# Patient Record
Sex: Male | Born: 1995 | Race: White | Hispanic: No | Marital: Single | Smoking: Never smoker
Health system: Southern US, Community
[De-identification: ages and names within clinical notes are randomized; demographics above are authoritative.]

## PROBLEM LIST (undated history)

## (undated) DIAGNOSIS — G43909 Migraine, unspecified, not intractable, without status migrainosus: Secondary | ICD-10-CM

## (undated) HISTORY — DX: Migraine, unspecified, not intractable, without status migrainosus: G43.909

---
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
## 2013-09-10 ENCOUNTER — Encounter: Payer: Self-pay | Admitting: Family Medicine

## 2013-09-10 ENCOUNTER — Ambulatory Visit (INDEPENDENT_AMBULATORY_CARE_PROVIDER_SITE_OTHER): Payer: Managed Care, Other (non HMO) | Admitting: Family Medicine

## 2013-09-10 VITALS — BP 126/88 | Wt 185.0 lb

## 2013-09-10 DIAGNOSIS — B9789 Other viral agents as the cause of diseases classified elsewhere: Secondary | ICD-10-CM

## 2013-09-10 DIAGNOSIS — R509 Fever, unspecified: Secondary | ICD-10-CM

## 2013-09-10 DIAGNOSIS — B349 Viral infection, unspecified: Secondary | ICD-10-CM

## 2013-09-10 LAB — POCT INFLUENZA A/B
Influenza A, POC: NEGATIVE
Influenza B, POC: NEGATIVE

## 2013-09-10 NOTE — Patient Instructions (Signed)
Did play fluids and use either Advil  for aches and pains. Can take 4 Advil 3 times per day for the aches and

## 2013-09-10 NOTE — Progress Notes (Signed)
   Subjective:    Patient ID: Alexander Chase, male    DOB: 12/18/1995, 17 y.o.   MRN: [id number]  HPI This morning he woke up with fever, myalgias him a stomach discomfort and developed a rash that subsequently went away. It was quite pruritic. He has a slight cough and continues with fever myalgias. No sore throat or earache.   Review of Systems     Objective:   Physical Exam alert and in no distress. Tympanic membranes and canals are normal. Throat is clear. Tonsils are normal. Neck is supple without adenopathy or thyromegaly. Cardiac exam shows a regular sinus rhythm without murmurs or gallops. Lungs are clear to auscultation. Influenza test negative       Assessment & Plan:  Fever and chills - Plan: Influenza A/B  Viral syndrome  recommend supportive care. Call if further trouble.

## 2015-05-20 ENCOUNTER — Ambulatory Visit (INDEPENDENT_AMBULATORY_CARE_PROVIDER_SITE_OTHER): Payer: BLUE CROSS/BLUE SHIELD | Admitting: Family Medicine

## 2015-05-20 ENCOUNTER — Encounter: Payer: Self-pay | Admitting: Family Medicine

## 2015-05-20 VITALS — BP 130/90 | HR 72 | Wt 183.0 lb

## 2015-05-20 DIAGNOSIS — K130 Diseases of lips: Secondary | ICD-10-CM | POA: Diagnosis not present

## 2015-05-20 DIAGNOSIS — R6889 Other general symptoms and signs: Secondary | ICD-10-CM

## 2015-05-20 NOTE — Progress Notes (Signed)
   Subjective:    Patient ID: Alexander Chase, male    DOB: 1996-08-15, 19 y.o.   MRN: [id number]  HPI He is here for a 2 day history of upper lip irritation. He states area inside his upper lip had bumps on it yesterday but today the bumps are gone and he only has some irritation to his left inner upper lip. He denies numbness, tingling, itching, swelling. He has been using Abreva yesterday and today. He recently started smoking E-cigs and has a history of smokeless tobacco use. He states he has never had a cold sore.    Review of Systems  Review of Systems Constitutional: -fever, -chills, -sweats, -unexpected weight change,-fatigue ENT: -runny nose, -sore throat Gastroenterology:  -nausea, -vomiting, -diarrhea       Objective:   Physical Exam  Alert and in no distress. No rash, erythema or edema to face. Pharyngeal area is normal. Gums and tongue normal. Mucous membranes moist. Mildly dry, chapped upper lip that is worse on the left upper vermilion. Neck is supple without adenopathy.       Assessment & Plan:  Lip symptom  He appears to have mildly chapped lips. Reports his lip is already much improved since this morning, I am not concerned that this is anything worrisome and assured him that his lip would probably look back to normal in a day or two. Encouraged him to avoid spicy or irritating foods and drinks that might cause him discomfort and to cut back on his e-cig use. He may use chap-stick or a mild moisturizer to his lips. Suggested he avoid any lip balms with menthol or alcohol in it as this may dry his lips.  Offered a flu shot. Suggested he schedule for a physical exam since it has been so long.

## 2015-05-21 ENCOUNTER — Encounter: Payer: Self-pay | Admitting: Family Medicine

## 2015-05-21 ENCOUNTER — Ambulatory Visit (INDEPENDENT_AMBULATORY_CARE_PROVIDER_SITE_OTHER): Payer: BLUE CROSS/BLUE SHIELD | Admitting: Family Medicine

## 2015-05-21 ENCOUNTER — Ambulatory Visit: Payer: Managed Care, Other (non HMO) | Admitting: Family Medicine

## 2015-05-21 VITALS — BP 140/100 | HR 76

## 2015-05-21 DIAGNOSIS — K120 Recurrent oral aphthae: Secondary | ICD-10-CM | POA: Diagnosis not present

## 2015-05-21 NOTE — Progress Notes (Signed)
   Subjective:    Patient ID: Alexander Chase, male    DOB: 1996/02/05, 19 y.o.   MRN: [id number]  HPI He is here for a sore to his upper lip. He was here yesterday and returned today because he is concerned that he could have cancer to his lip. Reports a mild burning sensation but the pain is non radiating.  He smokes e-cigs. Denies history of herpes but does admit to having a hard small cyst to upper gum that has been there for a while. He states he has been very worried about his lip. Has tried using lip balm. Has not been to a dentist in a long time. Denies fever, chills, fatigue, weight loss, nausea, vomiting, changes in bowel habits.    Review of Systems Pertinent positives and negatives in HPI.     Objective:   Physical Exam  Constitutional: He appears well-developed and well-nourished. No distress.  HENT:  Mouth/Throat: Uvula is midline, oropharynx is clear and moist and mucous membranes are normal. Oral lesions present. No oropharyngeal exudate, posterior oropharyngeal edema, posterior oropharyngeal erythema or tonsillar abscesses.    Small pinpoint hard white cyst to upper gum line           Assessment & Plan:  Ulcer aphthous oral  Reassured him that the ulcer on his upper lip does not appear to be worrisome. He appears to have an aphthous ulcer and recommend using OTC Anbesol for discomfort. Also may try using salt water rinse and spit. Discussed that the ulcer could be an early herpetic lesion but unlikely. Explained that he does appear to have a small pinpoint cyst to his upper gum and that since this has been there for a while, recommend that he schedule appointment with dentist.  Dr. Lynelle Doctor also examined this patient and agrees with assessment and plan.

## 2015-06-22 ENCOUNTER — Encounter: Payer: Self-pay | Admitting: Family Medicine

## 2015-06-22 ENCOUNTER — Ambulatory Visit (INDEPENDENT_AMBULATORY_CARE_PROVIDER_SITE_OTHER): Payer: BLUE CROSS/BLUE SHIELD | Admitting: Family Medicine

## 2015-06-22 VITALS — BP 138/88 | HR 64 | Temp 98.4°F | Wt 188.0 lb

## 2015-06-22 DIAGNOSIS — K130 Diseases of lips: Secondary | ICD-10-CM

## 2015-06-22 NOTE — Progress Notes (Signed)
   Subjective:    Patient ID: Alexander Chase, male    DOB: [birth year], 19 y.o.   MRN: [id number]  HPI He is here for a 6 week history of an upper lip lesion that he is concerned about.  He reports having occasional flares, with no known triggers, and during those flares he states his upper lip has a burning sensation and appears more irritated than it does today. He states overall the lesion is unchanged over the past 6 weeks. He has tried medicated chapstick and anbesol. Denies fever, chills, sore throat. He denies smoking and states he stopped using e-cigs approximately 5 weeks ago.  Reviewed allergies, medications, past medical history, social history.  Review of Systems Pertinent positives and negatives in the history of present illness.    Objective:   Physical Exam  Alert and in no distress. Upper center lip, at the philtral tubercle, with a round slightly erythematous area with smooth edges. Pharyngeal area is normal. No other oral lesions noted. Neck is supple without adenopathy.       Assessment & Plan:  Lip lesion - Plan: Ambulatory referral to Dermatology  Discussed that over the course of a 6 week period his lip appears unchanged, which is reassuring.  Dr. Susann Givens also evaluated him today. Discussed that he has also been evaluated by a dentist. Will refer him to dermatology for further evaluation and treatment.

## 2015-07-15 ENCOUNTER — Encounter: Payer: Self-pay | Admitting: Family Medicine

## 2015-07-15 ENCOUNTER — Ambulatory Visit (INDEPENDENT_AMBULATORY_CARE_PROVIDER_SITE_OTHER): Payer: BLUE CROSS/BLUE SHIELD | Admitting: Family Medicine

## 2015-07-15 VITALS — BP 120/78 | HR 64 | Wt 189.4 lb

## 2015-07-15 DIAGNOSIS — K130 Diseases of lips: Secondary | ICD-10-CM | POA: Diagnosis not present

## 2015-07-15 DIAGNOSIS — K120 Recurrent oral aphthae: Secondary | ICD-10-CM

## 2015-07-15 NOTE — Progress Notes (Signed)
   Subjective:    Patient ID: Alexander Chase, male    DOB: 1996/08/25, 19 y.o.   MRN: [id number]  HPI Chief Complaint  Patient presents with  . lips flared up    lips flared up today and still flared up. burning. has been worsing- roof of mouth feels raw to patient.     He is here for lip lesion to upper lip that has been present for almost 10 weeks. Reports irritation without tingling. He has been to the dentist and told there was nothing to worry about and then saw dermatology and was told that this could be yeast vs lichen planus. He has not yet picked up medication called in by dermatologist. States he is going to get this today. He also reports irritation to roof of mouth and states he has a canker sore. He is scheduled to return to dentist for dental work including a root canal.     Review of Systems Pertinent positives and negatives in the history of present illness.    Objective:   Physical Exam   Alert and oriented and in no acute distress. Upper lip dry and chapped. Small ulceration to anterior hard palate, no other oral lesions seen. Tongue normal.         Assessment & Plan:  Lip lesion   Reassured him that he has had lip lesion evaluated by dentist and dermatologist and by multiple providers in this office and it does not appear to be getting worse over the course of several weeks. Recommend that he use the triamcinolone ointment that was prescribed by the dermatologist and let me know if this helps. Discussed that if this is a yeast etiology that it may get worse and to stop using it if it does and give me or the dermatologist a call. Discussed using anbesol to  Aphthous ulcer on roof of mouth and avoid eating foods that irritate his mouth. Will consider blood work if no improvement or if repeat canker sores.

## 2015-08-03 ENCOUNTER — Encounter: Payer: Self-pay | Admitting: Family Medicine

## 2015-08-03 ENCOUNTER — Ambulatory Visit (INDEPENDENT_AMBULATORY_CARE_PROVIDER_SITE_OTHER): Payer: BLUE CROSS/BLUE SHIELD | Admitting: Family Medicine

## 2015-08-03 VITALS — BP 130/80 | HR 64 | Wt 192.0 lb

## 2015-08-03 DIAGNOSIS — K130 Diseases of lips: Secondary | ICD-10-CM

## 2015-08-03 DIAGNOSIS — F4010 Social phobia, unspecified: Secondary | ICD-10-CM | POA: Diagnosis not present

## 2015-08-03 DIAGNOSIS — F411 Generalized anxiety disorder: Secondary | ICD-10-CM | POA: Diagnosis not present

## 2015-08-03 LAB — CBC WITH DIFFERENTIAL/PLATELET
BASOS PCT: 1 % (ref 0–1)
Basophils Absolute: 0.1 10*3/uL (ref 0.0–0.1)
EOS PCT: 1 % (ref 0–5)
Eosinophils Absolute: 0.1 10*3/uL (ref 0.0–0.7)
HEMATOCRIT: 46.5 % (ref 39.0–52.0)
HEMOGLOBIN: 15.7 g/dL (ref 13.0–17.0)
LYMPHS PCT: 25 % (ref 12–46)
Lymphs Abs: 1.7 10*3/uL (ref 0.7–4.0)
MCH: 31.7 pg (ref 26.0–34.0)
MCHC: 33.8 g/dL (ref 30.0–36.0)
MCV: 93.8 fL (ref 78.0–100.0)
MONO ABS: 0.4 10*3/uL (ref 0.1–1.0)
MONOS PCT: 6 % (ref 3–12)
MPV: 11.2 fL (ref 8.6–12.4)
Neutro Abs: 4.6 10*3/uL (ref 1.7–7.7)
Neutrophils Relative %: 67 % (ref 43–77)
Platelets: 249 10*3/uL (ref 150–400)
RBC: 4.96 MIL/uL (ref 4.22–5.81)
RDW: 13.4 % (ref 11.5–15.5)
WBC: 6.9 10*3/uL (ref 4.0–10.5)

## 2015-08-03 LAB — COMPREHENSIVE METABOLIC PANEL
ALBUMIN: 4.6 g/dL (ref 3.6–5.1)
ALT: 28 U/L (ref 9–46)
AST: 26 U/L (ref 10–40)
Alkaline Phosphatase: 72 U/L (ref 40–115)
BILIRUBIN TOTAL: 0.8 mg/dL (ref 0.2–1.2)
BUN: 13 mg/dL (ref 7–25)
CALCIUM: 10.1 mg/dL (ref 8.6–10.3)
CHLORIDE: 101 mmol/L (ref 98–110)
CO2: 29 mmol/L (ref 20–31)
Creat: 0.81 mg/dL (ref 0.60–1.35)
GLUCOSE: 99 mg/dL (ref 65–99)
Potassium: 4.7 mmol/L (ref 3.5–5.3)
Sodium: 140 mmol/L (ref 135–146)
Total Protein: 7.3 g/dL (ref 6.1–8.1)

## 2015-08-03 LAB — TSH: TSH: 1.328 u[IU]/mL (ref 0.350–4.500)

## 2015-08-03 NOTE — Patient Instructions (Signed)
Social Anxiety Disorder  Social anxiety disorder, previously called social phobia, is a mental disorder. People with social anxiety disorder frequently feel nervous, afraid, or embarrassed when around other people in social situations. They constantly worry that other people are judging or criticizing them for how they look, what they say, or how they act. They may worry that other people might reject them because of their appearance or behavior.  Social anxiety disorder is more than just occasional shyness or self-consciousness. It can cause severe emotional distress. It can interfere with daily life activities. Social anxiety disorder also may lead to excessive alcohol or drug use and even suicide.   Social anxiety disorder is actually one of the most common mental disorders. It can develop at any time but usually starts in the teenage years. Women are more commonly affected than men. Social anxiety disorder is also more common in people who have family members with anxiety disorders. It also is more common in people who have physical deformities or conditions with characteristics that are obvious to others, such as stuttered speech or movement abnormalities (Parkinson disease).   SYMPTOMS   In addition to feeling anxious or fearful in social situations, people with social anxiety disorder frequently have physical symptoms. Examples include:  · Red face (blushing).  · Racing heart.  · Sweating.  · Shaky hands or voice.  · Confusion.  · Light-headedness.  · Upset stomach and diarrhea.  DIAGNOSIS   Social anxiety disorder is diagnosed through an assessment by your health care provider. Your health care provider will ask you questions about your mood, thoughts, and reactions in social situations. Your health care provider may ask you about your medical history and use of alcohol or drugs, including prescription medicines. Certain medical conditions and the use of certain substances, including caffeine, can cause  symptoms similar to social anxiety disorder. Your health care provider may refer you to a mental health specialist for further evaluation or treatment.  The criteria for diagnosis of social anxiety disorder are:  · Marked fear or anxiety in one or more social situations in which you may be closely watched or studied by others. Examples of such situations include:    Interacting socially (having a conversation with others, going to a party, or meeting strangers).    Being observed (eating or drinking in public or being called on in class).    Performing in front of others (giving a speech).  · The social situations of concern almost always cause fear or anxiety, not just occasionally.  · People with social anxiety disorder fear that they will be viewed negatively in a way that will be embarrassing, will lead to rejection, or will offend others. This fear is out of proportion to the actual threat posed by the social situation.  · Often the triggering social situations are avoided, or they are endured with intense fear or anxiety. The fear, anxiety, or avoidance is persistent and lasts for 6 months or longer.  · The anxiety causes difficulty functioning in at least some parts of your daily life.  TREATMENT   Several types of treatment are available for social anxiety disorder. These treatments are often used in combination and include:   · Talk therapy. Group talk therapy allows you to see that you are not alone with these problems. Individual talk therapy helps you address your specific anxiety issues with a caring professional. The most effective forms of talk therapy for social anxiety disorder are cognitive-behavioral therapy and exposure therapy.   Cognitive-behavioral therapy helps you to identify and change negative thoughts and beliefs that are at the root of the disorder. Exposure therapy allows you to gradually face the situations that you fear most.  · Relaxation and coping techniques. These include deep  breathing, self-talk, meditation, visual imagery, and yoga. Relaxation techniques help to keep you calm in social situations.  · Social skills training. Social skills can be learned on your own or with the help of a talk therapist. They can help you feel more confident and comfortable in social situations.  · Medicine. For anxiety limited to performance situations (performance anxiety), medicine called beta blockers can help by reducing or preventing the physical symptoms of social anxiety disorder. For more persistent and generalized social anxiety, antidepressant medicine may be prescribed to help control symptoms. In severe cases of social anxiety disorder, strong antianxiety medicine, called benzodiazepines, may be prescribed on a limited basis and for a short time.     This information is not intended to replace advice given to you by your health care provider. Make sure you discuss any questions you have with your health care provider.     Document Released: 08/04/2005 Document Revised: 09/26/2014 Document Reviewed: 12/04/2012  Elsevier Interactive Patient Education ©2016 [employer].

## 2015-08-03 NOTE — Progress Notes (Signed)
   Subjective:    Patient ID: Alexander Chase, male    DOB: 09/16/1996, 19 y.o.   MRN: [id number]  HPI Chief Complaint  Patient presents with  . breakout on lip    breakout on lip. cream from dermatology is not working. gotten worse recently   He is here for rash to upper lip that he has seen dermatology for in recent past. He states he is using the triamcinolone ointment that they prescribed and that the rash appears to be getting worse. Denies pain, numbness, tingling to his upper lip. He states lip is unaffected by foods or liquids.  He also would like to discuss that he has been feeling anxious and "on edge" for the past couple of months. 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 He states he gets anxious when he is having to make phone calls and talk to people and that his hands feel sweaty and clammy. 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 He states his mother and sister have an issue with anxiety and both see counselors. He is requesting to see a counselor for anxiety as well. He denies suicidal or homicidal ideations.  Reviewed allergies, medications, past medical, and social history.  Review of Systems Pertinent positives and negatives in history of present illness.    Objective:   Physical Exam  Alert and in no distress. Upper center lip, at the philtral tubercle, with a round slightly erythematous area with smooth edges. Pharyngeal area is normal. Tongue is normal. No other oral lesions noted. Neck is supple without adenopathy.  GAD7 score- 13 (moderate) PHQ9 score- 0    Assessment & Plan:  Lip lesion  Generalized anxiety disorder - Plan: Ambulatory referral to Psychology, CBC with Differential/Platelet, Comprehensive metabolic panel, TSH  Social anxiety disorder - Plan: Ambulatory referral to  Psychology, CBC with Differential/Platelet, Comprehensive metabolic panel, TSH  Reviewed dermatologist's note and discussed with patient that he should follow up with dermatology as recommended for possible biopsy. Patient agrees to this plan. Congratulated him on asking to see a Veterinary surgeoncounselor. Discussed that he appears to be experiencing anxiety and that his depression screening was negative. Referral to counselor made and information provided for him to call and schedule appointment. He will follow-up with me and we will discuss possible medication, propanolol. Discussed this with Dr. Susann GivensLalonde as well. Will follow-up pending lab results.

## 2015-09-29 ENCOUNTER — Telehealth: Payer: Self-pay

## 2015-09-29 NOTE — Telephone Encounter (Signed)
Left message for patient to call office regarding flu vaccine. 

## 2015-11-30 ENCOUNTER — Ambulatory Visit
Admission: RE | Admit: 2015-11-30 | Discharge: 2015-11-30 | Disposition: A | Discharge status: Home / Self Care | Payer: BLUE CROSS/BLUE SHIELD | Source: Ambulatory Visit | Attending: Family Medicine | Admitting: Family Medicine

## 2015-11-30 ENCOUNTER — Encounter: Payer: Self-pay | Admitting: Family Medicine

## 2015-11-30 ENCOUNTER — Ambulatory Visit (INDEPENDENT_AMBULATORY_CARE_PROVIDER_SITE_OTHER): Payer: BLUE CROSS/BLUE SHIELD | Admitting: Family Medicine

## 2015-11-30 VITALS — BP 130/94 | HR 59 | Ht 68.5 in | Wt 188.0 lb

## 2015-11-30 DIAGNOSIS — S0992XA Unspecified injury of nose, initial encounter: Secondary | ICD-10-CM

## 2015-11-30 NOTE — Progress Notes (Signed)
   Subjective:    Patient ID: Alexander Chase, male    DOB: October 21, 1995, 20 y.o.   MRN: [id number]  HPI He was hit in the nose Saturday while playing basketball. He experienced a little bit of bleeding but no other symptoms. He can breathe fairly well out of both nostrils.   Review of Systems     Objective:   Physical Exam Alert and in no distress. Slight leftward deviation of the nose is noted. Nares on the left is slightly than on the right. Slight tenderness to palpation over the bridge of the       Assessment & Plan:  Nasal injury, initial encounter - Plan: DG Nasal Bones I discussed treatment of nasal fracture with him in regard to deviation versus being able to breathe equally on both sides.

## 2015-12-01 ENCOUNTER — Encounter: Payer: Self-pay | Admitting: Family Medicine

## 2015-12-23 ENCOUNTER — Encounter: Payer: Self-pay | Admitting: Medical

## 2015-12-23 ENCOUNTER — Ambulatory Visit (INDEPENDENT_AMBULATORY_CARE_PROVIDER_SITE_OTHER): Payer: BLUE CROSS/BLUE SHIELD | Admitting: Medical

## 2015-12-23 VITALS — BP 100/70 | HR 67 | Temp 98.3°F | Resp 16 | Wt 185.0 lb

## 2015-12-23 DIAGNOSIS — R21 Rash and other nonspecific skin eruption: Secondary | ICD-10-CM

## 2015-12-23 MED ORDER — TRIAMCINOLONE ACETONIDE 0.1 % EX CREA: 1.0000 "application " | TOPICAL_CREAM | Freq: Two times a day (BID) | CUTANEOUS | Status: DC

## 2015-12-23 NOTE — Progress Notes (Signed)
  Subjective:   Alexander Chase is a 20 y.o. male who presents for evaluation of a rash involving the bilat arms. Rash started 1 day ago. Lesions are pink, and flat in texture. Rash has not changed over time. Rash is pruritic. Associated symptoms: none. Patient denies: abdominal pain, arthralgia, congestion, cough, crankiness, fever, headache, irritability, myalgia, nausea and sore throat. Patient has not had contacts with similar rash. Patient has not had new exposures.  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  No other aggravating or relieving factors. No other complaint.   The following portions of the patient's history were reviewed and updated as appropriate: allergies, current medications, past family history, past medical history, past social history and problem list.  Review of Systems As in subjective above   Objective:   Gen: wd, wn, nad Skin: bilat forearms throughout with pink macular scattered rash, but no other rash Otherwise healthy appearing Pulses normal Lungs clear Heart :RRR< normal s1, s2, no murmurs Ext: no edema    Assessment:     Encounter Diagnosis  Name Primary?  . Rash and nonspecific skin eruption Yes    Plan:   Discussed symptoms and exam findings.  Etiology most likely either allergic reaction to anesthetic from recent dental procedure or contact dermatitis with unknown agent.  Begin OTC Benadryl oral 1-2 times daily, triamcinolone cream topically.   Advised he call and let dental office know of the possibility of reaction to whatever was given yesterday.  Follow up if not completely resolved within a week.

## 2015-12-24 ENCOUNTER — Encounter: Payer: Self-pay | Admitting: Medical

## 2015-12-24 ENCOUNTER — Ambulatory Visit (INDEPENDENT_AMBULATORY_CARE_PROVIDER_SITE_OTHER): Payer: BLUE CROSS/BLUE SHIELD | Admitting: Medical

## 2015-12-24 VITALS — BP 138/90 | HR 86 | Temp 97.6°F | Wt 186.0 lb

## 2015-12-24 DIAGNOSIS — T7840XD Allergy, unspecified, subsequent encounter: Secondary | ICD-10-CM

## 2015-12-24 DIAGNOSIS — L259 Unspecified contact dermatitis, unspecified cause: Secondary | ICD-10-CM | POA: Diagnosis not present

## 2015-12-24 MED ORDER — METHYLPREDNISOLONE ACETATE 80 MG/ML IJ SUSP
80.0000 mg | Freq: Once | INTRAMUSCULAR | Status: AC
  Administered 2015-12-24: 80 mg via INTRAMUSCULAR

## 2015-12-24 MED ORDER — PREDNISONE 10 MG PO TABS: ORAL_TABLET | ORAL | Status: DC

## 2015-12-24 MED ORDER — EPINEPHRINE 0.3 MG/0.3ML IJ SOAJ: 0.3000 mg | Freq: Once | INTRAMUSCULAR | Status: DC

## 2015-12-24 NOTE — Progress Notes (Signed)
  Subjective:   Alexander Chase is a 20 y.o. male who presents for recheck on rash.  I saw him yesterday for same, but over night got worsening rash and now face is swollen.  No tongue swelling, no wheezing, no SOB, no prior similar.    Yesterday he noted 1 day hx/o rash involving the bilat arms.  Lesions are pink, and flat in texture. Rash has not changed over time. Rash is pruritic. Associated symptoms: none. Patient denies: abdominal pain, arthralgia, congestion, cough, crankiness, fever, headache, irritability, myalgia, nausea and sore throat. Patient has not had contacts with similar rash. Patient has not had new exposures.  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 He has had prior poison ivy rash, but no recent poison ivy exposure. No other aggravating or relieving factors. No other complaint.  The following portions of the patient's history were reviewed and updated as appropriate: allergies, current medications, past family history, past medical history, past social history and problem list.  Review of Systems As in subjective above   Objective:   Gen: wd, wn, nad Skin: bilat forearms throughout with pink macular scattered rash, flushing and faint maculopapular rash of face but not within hair line, has 1 raised urticarial lesion on left lower flank Oral: no tongue swelling, lips slightly swollen Otherwise healthy appearing Pulses normal Lungs clear Heart :RRR, normal s1, s2, no murmurs Ext: no edema    Assessment:     Encounter Diagnoses  Name Primary?  . Contact dermatitis Yes  . Allergic reaction, subsequent encounter     Plan:   Discussed symptoms and exam findings.  Examined by Dr. Susann GivensLalonde supervising physician as well.  Etiology most likely either allergic reaction vs contact dermatitis with unknown agent.  C/t OTC Benadryl oral 1-2 times daily, triamcinolone cream topically.   80mg  Depo Medrol IM given today.   Script for  prednisone oral taper if rash and swelling persists >48 hours. If tongue swelling, airway swelling, SOB, then use Epipen and call 911.  Follow up if not completely resolved within a week.

## 2015-12-25 ENCOUNTER — Telehealth: Payer: Self-pay | Admitting: Medical

## 2015-12-25 NOTE — Telephone Encounter (Signed)
Please call patient HE is going to pick up Prednisone Rx and he needs to clarify if he is supposed to use the cream with the prednisone or just the prednisone

## 2015-12-25 NOTE — Telephone Encounter (Signed)
He can begin the oral prednisone AND can use the topical Triamcinolone as well.     How is he today?

## 2015-12-25 NOTE — Telephone Encounter (Signed)
C/t plan to use the oral steroid, topical steroid, benadryl 2-3 times daily.  If not improved by Monday call back.

## 2015-12-25 NOTE — Telephone Encounter (Signed)
Pt is aware.  

## 2015-12-25 NOTE — Telephone Encounter (Signed)
Pt said that he is feeling the same as yesterday, still swollen. Taken the medication and used the cream. Said the rash is spreading up his arm

## 2015-12-25 NOTE — Telephone Encounter (Signed)
Earlier today I told pt he could take both

## 2015-12-28 ENCOUNTER — Encounter: Payer: Self-pay | Admitting: Family Medicine

## 2015-12-28 ENCOUNTER — Ambulatory Visit (INDEPENDENT_AMBULATORY_CARE_PROVIDER_SITE_OTHER): Payer: BLUE CROSS/BLUE SHIELD | Admitting: Family Medicine

## 2015-12-28 VITALS — HR 75 | Temp 97.9°F | Wt 187.0 lb

## 2015-12-28 DIAGNOSIS — L299 Pruritus, unspecified: Secondary | ICD-10-CM

## 2015-12-28 DIAGNOSIS — R21 Rash and other nonspecific skin eruption: Secondary | ICD-10-CM

## 2015-12-28 MED ORDER — HYDROXYZINE HCL 25 MG PO TABS
25.0000 mg | ORAL_TABLET | Freq: Three times a day (TID) | ORAL | Status: DC | PRN
Start: 2015-12-28 — End: 2016-12-12

## 2015-12-28 NOTE — Progress Notes (Signed)
   Subjective:    Patient ID: Alexander Chase, male    DOB: 09-15-1996, 20 y.o.   MRN: [id number]  HPI Chief Complaint  Patient presents with  . Rash    spreading up arms and onto torso. itching worse now.    He is here with complaints of rash spreading to new areas of his body. States similar rash started last week, 6 days ago, and [place]. Unknown allergen.  He started medication 5 days ago and was also given steroid injection and started prednisone dose pak 4 days ago, he is still taking this. Taking Benadryl every 4 hours and once daily Zyrtec in morning.  He states rash was initially on his arms and face. Today he states the rash is more on his anterior upper legs, dorsal hands, and abdomen. States rash to left arm and face has improved. States rash is pruritc. Denies being outside or exposed to anything new since being seen last week.  Denies fever, chills, nausea, vomiting. Denies sores to mouth, shortness of breath, tongue swelling, abdominal pain.  He has an epipen at home and has not had to use it.    Review of Systems Pertinent positives and negatives in the history of present illness.     Objective:   Physical Exam BP   Pulse 75  Temp(Src) 97.9 F (36.6 C) (Tympanic)  Wt 187 lb (84.823 kg)  Alert and in no distress.  Pharyngeal area is normal. Neck is supple without adenopathy. Cardiac exam shows a regular sinus rhythm without murmurs or gallops. Lungs are clear to auscultation. Skin exam reveals a small area of pink, somewhat erythematous scattered rash that is blanchable to anterior upper thighs, upper abdomen and right lower abdomen. He has a faint urticaria rash to left abdomen and left posterior forearm with mild excoriation noted. Face with a mildly faint maculopapular rash.      Assessment & Plan:  Rash and nonspecific skin eruption - Plan: hydrOXYzine (ATARAX/VISTARIL) 25 MG tablet  Itching - Plan: hydrOXYzine (ATARAX/VISTARIL) 25 MG  tablet  Discussed that he appears to be having a contact dermatitis reaction but unclear the etiology. The areas that were affected last week are overall improved but he does have some new areas today. He should continue with steroid dose pak, triamcinolone and we I will also prescribe Atarax to control itching since Benadryl is not effective. He will stop Benadryl and Zyrtec and switch to Claritin or Allegra. Recommend also taking cool showers and using cool compresses for itching. Dr. Susann GivensLalonde also examined this patient.

## 2015-12-28 NOTE — Patient Instructions (Signed)
Stop taking Zyrtec and switch to Claritin or Allegra daily. Complete the course of steroids. Make sure you are staying well hydrated.  Try taking Atarax for itching but use caution when he first take this as it can make you drowsy so do not drive or operate machinery.  Hydroxyzine capsules or tablets What is this medicine? HYDROXYZINE (hye DROX i zeen) is an antihistamine. This medicine is used to treat allergy symptoms. It is also used to treat anxiety and tension. This medicine can be used with other medicines to induce sleep before surgery. This medicine may be used for other purposes; ask your health care provider or pharmacist if you have questions. What should I tell my health care provider before I take this medicine? They need to know if you have any of these conditions: -any chronic illness -difficulty passing urine -glaucoma -heart disease -kidney disease -liver disease -lung disease -an unusual or allergic reaction to hydroxyzine, cetirizine, other medicines, foods, dyes, or preservatives -pregnant or trying to get pregnant -breast-feeding How should I use this medicine? Take this medicine by mouth with a full glass of water. Follow the directions on the prescription label. You may take this medicine with food or on an empty stomach. Take your medicine at regular intervals. Do not take your medicine more often than directed. Talk to your pediatrician regarding the use of this medicine in children. Special care may be needed. While this drug may be prescribed for children as young as 786 years of age for selected conditions, precautions do apply. Patients over 22 years old may have a stronger reaction and need a smaller dose. Overdosage: If you think you have taken too much of this medicine contact a poison control center or emergency room at once. NOTE: This medicine is only for you. Do not share this medicine with others. What if I miss a dose? If you miss a dose, take it as soon  as you can. If it is almost time for your next dose, take only that dose. Do not take double or extra doses. What may interact with this medicine? -alcohol -barbiturate medicines for sleep or seizures -medicines for colds, allergies -medicines for depression, anxiety, or emotional disturbances -medicines for pain -medicines for sleep -muscle relaxants This list may not describe all possible interactions. Give your health care provider a list of all the medicines, herbs, non-prescription drugs, or dietary supplements you use. Also tell them if you smoke, drink alcohol, or use illegal drugs. Some items may interact with your medicine. What should I watch for while using this medicine? Tell your doctor or health care professional if your symptoms do not improve. You may get drowsy or dizzy. Do not drive, use machinery, or do anything that needs mental alertness until you know how this medicine affects you. Do not stand or sit up quickly, especially if you are an older patient. This reduces the risk of dizzy or fainting spells. Alcohol may interfere with the effect of this medicine. Avoid alcoholic drinks. Your mouth may get dry. Chewing sugarless gum or sucking hard candy, and drinking plenty of water may help. Contact your doctor if the problem does not go away or is severe. This medicine may cause dry eyes and blurred vision. If you wear contact lenses you may feel some discomfort. Lubricating drops may help. See your eye doctor if the problem does not go away or is severe. If you are receiving skin tests for allergies, tell your doctor you are using this medicine.  What side effects may I notice from receiving this medicine? Side effects that you should report to your doctor or health care professional as soon as possible: -fast or irregular heartbeat -difficulty passing urine -seizures -slurred speech or confusion -tremor Side effects that usually do not require medical attention (report to  your doctor or health care professional if they continue or are bothersome): -constipation -drowsiness -fatigue -headache -stomach upset This list may not describe all possible side effects. Call your doctor for medical advice about side effects. You may report side effects to FDA at 1-800-FDA-1088. Where should I keep my medicine? Keep out of the reach of children. Store at room temperature between 15 and 30 degrees C (59 and 86 degrees F). Keep container tightly closed. Throw away any unused medicine after the expiration date. NOTE: This sheet is a summary. It may not cover all possible information. If you have questions about this medicine, talk to your doctor, pharmacist, or health care provider.    2016, Elsevier/[employer] Standard. (2008-01-18 14:50:59)

## 2016-01-11 ENCOUNTER — Ambulatory Visit (INDEPENDENT_AMBULATORY_CARE_PROVIDER_SITE_OTHER): Payer: BLUE CROSS/BLUE SHIELD | Admitting: Family Medicine

## 2016-01-11 ENCOUNTER — Encounter: Payer: Self-pay | Admitting: Family Medicine

## 2016-01-11 VITALS — BP 132/90 | HR 72 | Temp 98.6°F | Wt 186.6 lb

## 2016-01-11 DIAGNOSIS — J029 Acute pharyngitis, unspecified: Secondary | ICD-10-CM

## 2016-01-11 LAB — POCT RAPID STREP A (OFFICE): RAPID STREP A SCREEN: NEGATIVE

## 2016-01-11 NOTE — Progress Notes (Signed)
   Subjective:    Patient ID: Alexander Chase, male    DOB: 12-18-1995, 20 y.o.   MRN: [id number]  HPI He has a 1 day history of sore throat but no earache, cough, congestion, fever or chills.   Review of Systems     Objective:   Physical Exam Alert and in no distress. Tympanic membranes and canals are normal. Pharyngeal area is normal. Neck is supple without adenopathy or thyromegaly. Cardiac exam shows a regular sinus rhythm without murmurs or gallops. Lungs are clear to auscultation. Strep screen is negative      Assessment & Plan:  Sore throat - Plan: Rapid Strep A suggested supportive care. No further intervention needed.

## 2016-12-12 ENCOUNTER — Encounter: Payer: Self-pay | Admitting: Family Medicine

## 2016-12-12 ENCOUNTER — Ambulatory Visit (INDEPENDENT_AMBULATORY_CARE_PROVIDER_SITE_OTHER): Payer: BLUE CROSS/BLUE SHIELD | Admitting: Family Medicine

## 2016-12-12 VITALS — BP 130/82 | HR 78 | Temp 98.5°F | Wt 204.8 lb

## 2016-12-12 DIAGNOSIS — R209 Unspecified disturbances of skin sensation: Secondary | ICD-10-CM | POA: Diagnosis not present

## 2016-12-12 NOTE — Progress Notes (Signed)
   Subjective:    Patient ID: Alexander Chase, male    DOB: 01-08-1996, 21 y.o.   MRN: [id number]  HPI Chief Complaint  Patient presents with  . feet issues    feet issues- itches, and achy, red for the last 2 weeks but gotten worse the last couple days. look like his toes are purple   He is here with a 1 week history of his feet appearing more red and intermittent itching. States his toes are colder than usual. 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  Denies fever, chills, foot pain, numbness, tingling or weakness.  He has not tried anything for his symptoms.    Review of Systems Pertinent positives and negatives in the history of present illness.     Objective:   Physical Exam  Constitutional: He appears well-developed and well-nourished. No distress.  Musculoskeletal: Normal range of motion.       Right ankle: Normal.       Left ankle: Normal.       Right foot: Normal.       Left foot: Normal.  No erythema, edema or tenderness. Normal sensation, cap refill, pulses, ROM and strength bilaterally    BP 130/82   Pulse 78   Temp 98.5 F (36.9 C) (Oral)   Wt 204 lb 12.8 oz (92.9 kg)   BMI 30.69 kg/m      Assessment & Plan:  Cold feet  Discussed that his foot exam is unremarkable. I recommend that he try to keep his feet as dry as possible by changing his socks often and wearing water resistant boots. He will follow up if his symptoms worsen.

## 2017-03-31 ENCOUNTER — Encounter: Payer: Self-pay | Admitting: Family Medicine

## 2017-03-31 ENCOUNTER — Ambulatory Visit (INDEPENDENT_AMBULATORY_CARE_PROVIDER_SITE_OTHER): Payer: BLUE CROSS/BLUE SHIELD | Admitting: Family Medicine

## 2017-03-31 VITALS — BP 120/84 | HR 88 | Wt 207.4 lb

## 2017-03-31 DIAGNOSIS — H578 Other specified disorders of eye and adnexa: Secondary | ICD-10-CM | POA: Diagnosis not present

## 2017-03-31 DIAGNOSIS — H5789 Other specified disorders of eye and adnexa: Secondary | ICD-10-CM

## 2017-03-31 MED ORDER — POLYMYXIN B-TRIMETHOPRIM 10000-0.1 UNIT/ML-% OP SOLN: 1.0000 [drp] | Freq: Four times a day (QID) | OPHTHALMIC | 0 refills | Status: DC

## 2017-03-31 NOTE — Progress Notes (Signed)
   Subjective:    Patient ID: Alexander Chase, male    DOB: [birth year], 21 y.o.   MRN: [id number]  HPI Chief Complaint  Patient presents with  . tear duct swollen    tear duct swollen. noticied it at lunch today, no blurried vision, not seeing stars or anything. gotten better since lunch   He is here with complaints of the inner corner of his right eye feeling irritated since lunch time today. Denies injury or feeling a foreign body sensation. No eye pain, drainage or vision changes. Denies fever, chills, headache, rhinorrhea, sneezing, nasal congestion, ear pain or sore throat.  Has not tried anything for his symptoms.   Reviewed allergies, medications, past medical, and social history.   Review of Systems Pertinent positives and negatives in the history of present illness.      Objective:   Physical Exam  Constitutional: He appears well-developed and well-nourished. No distress.  HENT:  Nose: Nose normal.  Mouth/Throat: Uvula is midline, oropharynx is clear and moist and mucous membranes are normal.  Eyes: Pupils are equal, round, and reactive to light. Conjunctivae, EOM and lids are normal.    Mild erythema and trace of edema to inner canthus of right eye. No drainage, conjunctivitis or sign of infection. Normal EOM without pain.    BP 120/84   Pulse 88   Wt 207 lb 6.4 oz (94.1 kg)   BMI 31.08 kg/m       Assessment & Plan:  Eye irritation  No sign of infection at this point. Recommend using warm compresses and saline or clear eye drops. If he notices any worsening symptoms such as thick drainage over the weekend then he may start the antibiotic eye drops. Otherwise, he is aware that he does not need to use them if symptoms resolve on their own.

## 2017-03-31 NOTE — Patient Instructions (Signed)
Use warm compresses and clear eyes or visine. If you notice any worsening thick drainage then use the drops.

## 2017-09-18 IMAGING — CR DG NASAL BONES 3+V
3 series · 3 of 3 positions shown · non-contrast
Comparison: None.

CLINICAL DATA: Elbowed in the nose playing basketball 2 days ago.

EXAM:
NASAL BONES - 3+ VIEW

[w waters pa]
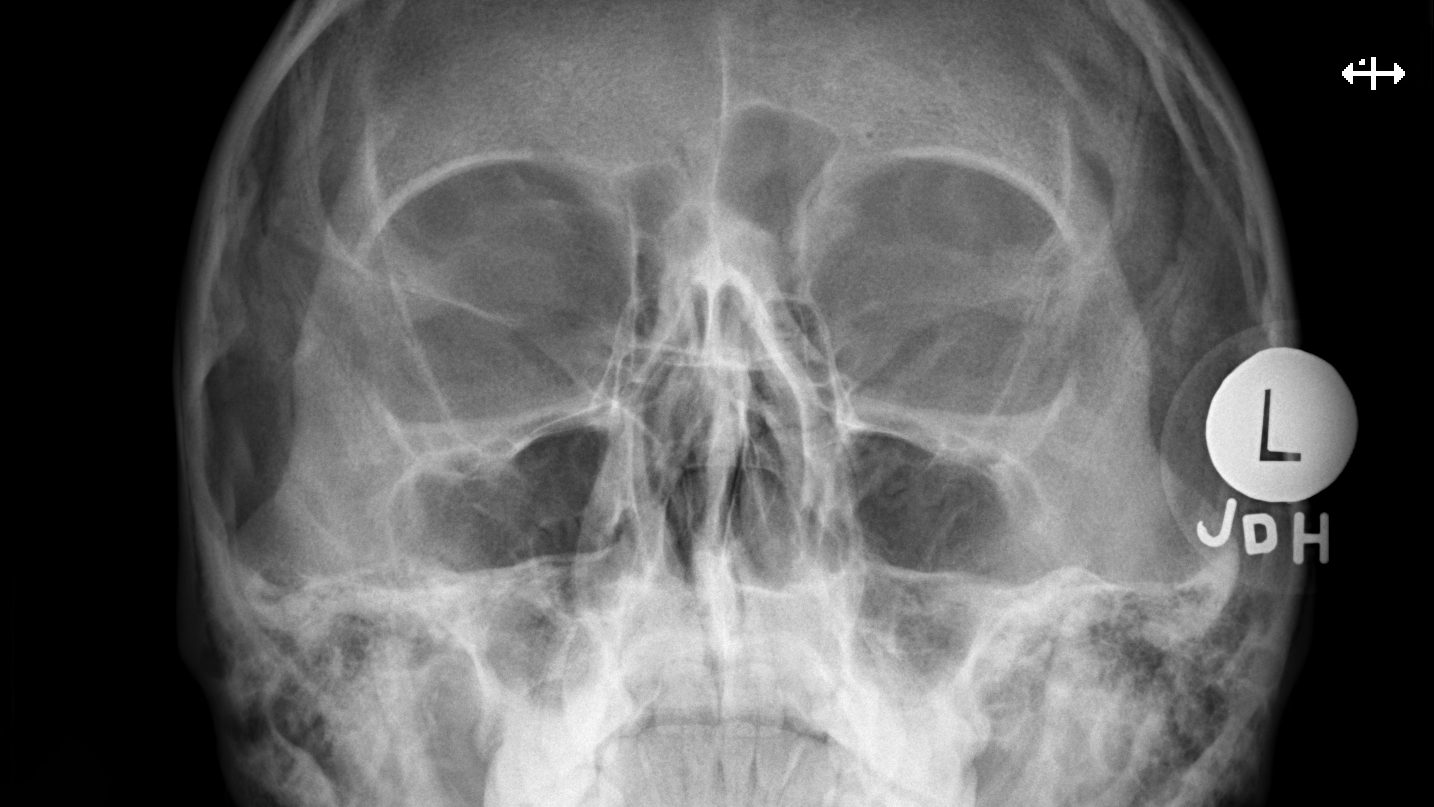

[w nasal bone lat (1 of 2)]
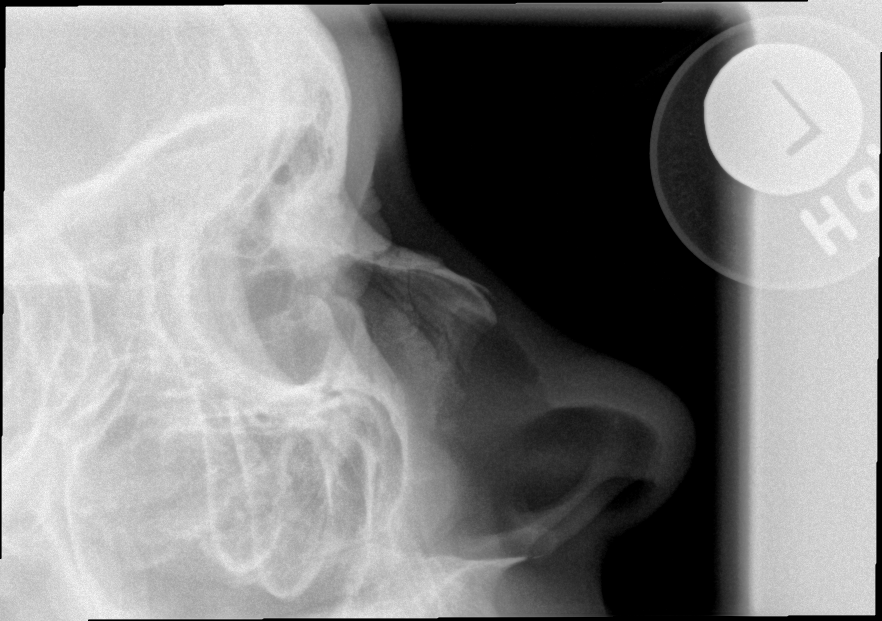

[w nasal bone lat (2 of 2)]
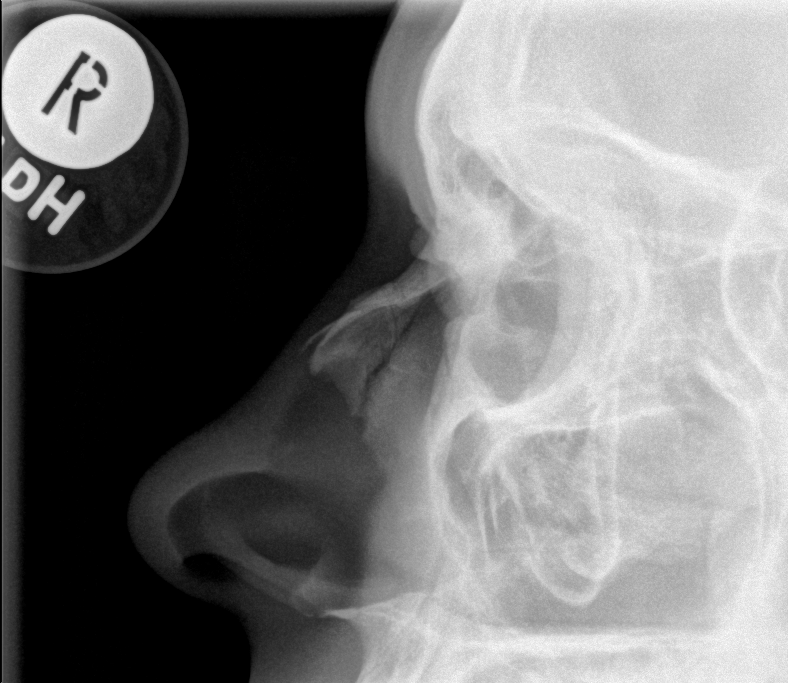

[3 of 3 positions shown; findings below may reference images not displayed]

FINDINGS: Suspect a fracture of the bony nasal bridge and possible left nasal
bone. The anterior inferior nasal spine is intact. The paranasal
sinuses are clear.
IMPRESSION: Suspect a fracture of the bony nasal bridge and possibly the left
nasal bone.

## 2018-09-19 HISTORY — PX: DENTAL SURGERY: SHX609

## 2021-02-25 ENCOUNTER — Encounter: Payer: Self-pay | Admitting: Family Medicine

## 2021-02-25 ENCOUNTER — Other Ambulatory Visit: Payer: Self-pay

## 2021-02-25 ENCOUNTER — Ambulatory Visit (INDEPENDENT_AMBULATORY_CARE_PROVIDER_SITE_OTHER): Payer: Self-pay | Admitting: Family Medicine

## 2021-02-25 VITALS — BP 140/100 | HR 80 | Temp 99.1°F | Ht 70.0 in | Wt 153.0 lb

## 2021-02-25 DIAGNOSIS — F1290 Cannabis use, unspecified, uncomplicated: Secondary | ICD-10-CM

## 2021-02-25 DIAGNOSIS — R300 Dysuria: Secondary | ICD-10-CM

## 2021-02-25 DIAGNOSIS — Z113 Encounter for screening for infections with a predominantly sexual mode of transmission: Secondary | ICD-10-CM

## 2021-02-25 DIAGNOSIS — R350 Frequency of micturition: Secondary | ICD-10-CM

## 2021-02-25 DIAGNOSIS — Z7289 Other problems related to lifestyle: Secondary | ICD-10-CM

## 2021-02-25 LAB — POCT URINALYSIS DIP (PROADVANTAGE DEVICE)
Bilirubin, UA: NEGATIVE
Blood, UA: NEGATIVE
Glucose, UA: NEGATIVE mg/dL
Ketones, POC UA: NEGATIVE mg/dL
Leukocytes, UA: NEGATIVE
Nitrite, UA: NEGATIVE
Protein Ur, POC: NEGATIVE mg/dL
Specific Gravity, Urine: 1.015
Urobilinogen, Ur: NEGATIVE
pH, UA: 7.5 (ref 5.0–8.0)

## 2021-02-25 NOTE — Patient Instructions (Signed)
  Cut back on caffeine and alcohol to see if urinary frequency improves.  Try either vaseline or a zinc oxide as a barrier, to help with any irritation, especially that is worsened with voiding.  I cannot rule out the formation of perhaps a small wart.  Keep an eye on this.  If the area of irritation grows, it should be re-evaluated.  I would check with a parent to see if you ever had HPV vaccine (Gardisil).  If you haven't, it is recommended.   Always use condoms to prevent STD and pregnancy.

## 2021-02-25 NOTE — Progress Notes (Signed)
Chief Complaint  Patient presents with   Urinary Frequency    Urinary frequency, urgency and burning when he urinates (esp on the tip of penis). Symptoms started Sat. Went to [facility] Monday and was tested for GC/Chlam and trich-all negative-did not do UA and is still having the symptoms he presented with Monday at [facility].    Depression    PHQ9(4)   6/3 he went to dinner with mother's friend. Went back to her house, they had unprotected sex. Morning of 6/5 started with symptoms--penile discharge, pain with urination, and inflammation at tip of penis. The discharge was only present on the first day, then resolved. His bladder seems irritated, some disomfort in lower abdmen and urinary frequency. He states they didn't use any lubricants, no condoms.   He went to UC on 6/6, and was treated with doxycyline and rocephin.  He is tolerating antibiotics without side effects.  He tested negative for GC, chlamydia and trichomonas.  He reports that his partner tested negative for GC and chlamydia.  Patient reports suspecting white coat hypertension, that BP is elevated at doctors.  He hasn't checked BP elsewhere.  There is +FH HTN  Past Medical History:  Diagnosis Date   Migraine    childhood, resolved   Past Surgical History:  Procedure Laterality Date   DENTAL SURGERY Bilateral 2020   lower jaw, infected root canals   Social History   Social History Narrative   Lives alone, 1 dog.   Works as a Patent attorney.   Drinks a lot of coffee.   Daily nicotine vaping, daily marijuana smoking   Social History   Tobacco Use   Smoking status: Never   Smokeless tobacco: Former   Tobacco comments:    e-cig (quit 6 months)  Vaping Use   Vaping Use: Every day   Substances: Nicotine   Devices: [place]  Substance Use Topics   Alcohol use: Not Currently    Comment: 3-4 drinks, 3x/week ([place])   Drug use: Yes    Types: Marijuana    Comment: smokes marijuana daily   Family History  Problem Relation  Age of Onset   Hypertension Mother    Hypertension Maternal Uncle    Asthma Maternal Uncle    Pancreatic cancer Maternal Grandmother    Cancer Maternal Grandmother     Outpatient Encounter Medications as of 02/25/2021  Medication Sig   doxycycline (VIBRAMYCIN) 100 MG capsule Take 100 mg by mouth 2 (two) times daily.   [DISCONTINUED] EPINEPHrine 0.3 mg/0.3 mL IJ SOAJ injection Inject 0.3 mLs (0.3 mg total) into the muscle once.   [DISCONTINUED] trimethoprim-polymyxin b (POLYTRIM) ophthalmic solution Place 1 drop into the right eye every 6 (six) hours.   No facility-administered encounter medications on file as of 02/25/2021.   No Known Allergies  ROS: no fever, chills, URi symptoms, cough, shortness of breath, chest pain, palpitations, n/v/d. No bleeding, bruising, rashes. Moods are good. Dysuria, urinary frequency per HPI.  No incontinence, no hematuria. Penile irritation; discharge resolved.  See HPI  PHYSICAL EXAM:  BP (!) 140/100   Pulse 80   Temp 99.1 F (37.3 C) (Tympanic)   Ht 5\' 10"  (1.778 m)   Wt 153 lb (69.4 kg)   BMI 21.95 kg/m   Well-appearing, pleasant, thin male in no distress HEENT: conjunctiva and sclera are clear, EOMI, wearing mask Neck: no lymphadenopathy, thyromegaly or mass Heart: regular rate and rhythm Lungs: clear bilaterally Back: no spinal or CVA tenderness Abdomen: soft, nontender.  Minimal  discomfort in suprapubic area. No rebound or guarding, no mass. Chest wall: nontender. GU: Small raised area at the urethral opening/tip of penis that is slightly raised, not particularly red No penile drainage. No lesions.  Testicles descended bilaterally, no masses.  No inguinal lymphadenopathy Extremities: no edema Psych: normal mood, affect, hygiene and grooming Neuro: alert and oriented, normal gait, strength  Urine dip: normal  ASSESSMENT/PLAN:  Urinary frequency - reassured no e/o infection. Cut back on caffeine, stay well hydrated - Plan: POCT  Urinalysis DIP (Proadvantage Device)  Burning with urination - suspect the discomfort is related to irritation at distal urethra. Can use vaseline or ZnOx as barrier until healed - Plan: POCT Urinalysis DIP (Proadvantage Device)  Screen for STD (sexually transmitted disease) - recent negative GC/chlamydia and trich, currently on treatment. Will check bloodwork. Encouraged condom use. Check if has had HPV vaccines, rec if hasn't - Plan: HIV Antibody (routine testing w rflx), RPR, Hepatitis B surface antigen, Hepatitis C antibody  Current every day vaping - counseled re: risks  Marijuana use, continuous - counseled re: potential risks, encouraged decreased use   HIV, HepB, HepC, RPR  Cut back on caffeine and alcohol to see if urinary frequency improves.  Try either vaseline or a zinc oxide as a barrier, to help with any irritation, especially that is worsened with voiding.  I cannot rule out the formation of perhaps a small wart.  Keep an eye on this.  If the area of irritation grows, it should be re-evaluated.  I would check with a parent to see if you ever had HPV vaccine (Gardisil).  If you haven't, it is recommended.   Always use condoms to prevent STD and pregnancy.

## 2021-02-26 LAB — RPR: RPR Ser Ql: NONREACTIVE

## 2021-02-26 LAB — HEPATITIS B SURFACE ANTIGEN: Hepatitis B Surface Ag: NEGATIVE

## 2021-02-26 LAB — HIV ANTIBODY (ROUTINE TESTING W REFLEX): HIV Screen 4th Generation wRfx: NONREACTIVE

## 2021-02-26 LAB — HEPATITIS C ANTIBODY: Hep C Virus Ab: <0.1 s/co ratio (ref 0.0–0.9)

## 2022-05-25 ENCOUNTER — Encounter: Payer: Self-pay | Admitting: Internal Medicine

## 2022-06-28 ENCOUNTER — Encounter: Payer: Self-pay | Admitting: Internal Medicine

## 2022-07-11 ENCOUNTER — Encounter: Payer: Self-pay | Admitting: Internal Medicine

## 2023-11-14 ENCOUNTER — Encounter: Payer: Self-pay | Admitting: Internal Medicine

## 2024-07-17 ENCOUNTER — Ambulatory Visit: Payer: Self-pay | Admitting: Family Medicine

## 2024-07-17 NOTE — Telephone Encounter (Signed)
 Attempted to contact patient-no answer. Voicemail left to call back to Nurse Triage. Will place in callbacks.

## 2024-07-17 NOTE — Telephone Encounter (Signed)
 Attempted to contact patient-no answer. Voicemail left for patient to call back to Nurse Triage. Last office visit was 02/25/2021.

## 2024-07-17 NOTE — Telephone Encounter (Signed)
 Pt had hung up upon warm transfer . This RN made first attempt to contact pt with no answer. A voicemail was left with call back number provided.          Copied from CRM [id number]. Topic: Clinical - Red Word Triage >> Jul 17, 2024  8:30 AM Fonda T wrote: [facility] that prompted transfer to Nurse Triage:  Patient states has been in a relationship with a new partner, has been having unprotected intercourse, states his partner tested positive for chlamydia and now Patient is having some symptoms/discomfort, and pain during urination. Reports he has not noiced any discharge or any other symptoms.   Patient requesting an evaluation as soon as possible.
